# Patient Record
Sex: Female | Born: 1955 | Race: Black or African American | Hispanic: No | Marital: Single | Smoking: Never smoker
Health system: Southern US, Community
[De-identification: ages and names within clinical notes are randomized; demographics above are authoritative.]

## PROBLEM LIST (undated history)

## (undated) DIAGNOSIS — E119 Type 2 diabetes mellitus without complications: Secondary | ICD-10-CM

## (undated) DIAGNOSIS — T466X5A Adverse effect of antihyperlipidemic and antiarteriosclerotic drugs, initial encounter: Secondary | ICD-10-CM

## (undated) DIAGNOSIS — I359 Nonrheumatic aortic valve disorder, unspecified: Secondary | ICD-10-CM

## (undated) DIAGNOSIS — I10 Essential (primary) hypertension: Secondary | ICD-10-CM

## (undated) DIAGNOSIS — G720 Drug-induced myopathy: Secondary | ICD-10-CM

## (undated) DIAGNOSIS — E785 Hyperlipidemia, unspecified: Secondary | ICD-10-CM

## (undated) HISTORY — PX: COLONOSCOPY: SHX174

---

## 2004-05-10 ENCOUNTER — Emergency Department: Payer: Self-pay | Admitting: Emergency Medicine

## 2004-05-16 ENCOUNTER — Ambulatory Visit: Payer: Self-pay | Admitting: Unknown Physician Specialty

## 2005-01-04 ENCOUNTER — Emergency Department: Payer: Self-pay | Admitting: Emergency Medicine

## 2005-04-07 ENCOUNTER — Other Ambulatory Visit: Payer: Self-pay

## 2005-04-07 ENCOUNTER — Emergency Department: Payer: Self-pay | Admitting: Emergency Medicine

## 2006-03-08 ENCOUNTER — Ambulatory Visit: Payer: Self-pay | Admitting: Gastroenterology

## 2009-02-25 ENCOUNTER — Emergency Department: Payer: Self-pay | Admitting: Emergency Medicine

## 2009-03-03 ENCOUNTER — Ambulatory Visit: Payer: Self-pay | Admitting: Family Medicine

## 2009-08-11 ENCOUNTER — Ambulatory Visit: Payer: Self-pay | Admitting: Internal Medicine

## 2009-11-15 ENCOUNTER — Other Ambulatory Visit: Payer: Self-pay | Admitting: Internal Medicine

## 2010-09-15 ENCOUNTER — Ambulatory Visit: Payer: Self-pay | Admitting: Unknown Physician Specialty

## 2010-09-16 LAB — PATHOLOGY REPORT

## 2012-03-29 ENCOUNTER — Emergency Department: Payer: Self-pay | Admitting: Emergency Medicine

## 2012-03-29 LAB — CK TOTAL AND CKMB (NOT AT ARMC)
CK, Total: 154 U/L (ref 21–215)
CK-MB: 0.9 ng/mL (ref 0.5–3.6)

## 2012-03-29 LAB — URINALYSIS, COMPLETE
Bilirubin,UR: NEGATIVE
Glucose,UR: NEGATIVE mg/dL (ref 0–75)
Nitrite: NEGATIVE
Ph: 6 (ref 4.5–8.0)
Protein: NEGATIVE
RBC,UR: 1 /HPF (ref 0–5)
Specific Gravity: 1.004 (ref 1.003–1.030)
Squamous Epithelial: 2
WBC UR: 2 /HPF (ref 0–5)

## 2012-03-29 LAB — CBC
HCT: 36.5 % (ref 35.0–47.0)
MCH: 28.2 pg (ref 26.0–34.0)
MCHC: 32.7 g/dL (ref 32.0–36.0)
MCV: 86 fL (ref 80–100)
Platelet: 266 10*3/uL (ref 150–440)
RDW: 13.4 % (ref 11.5–14.5)

## 2012-03-29 LAB — TROPONIN I: Troponin-I: <0.02 ng/mL

## 2012-03-29 LAB — COMPREHENSIVE METABOLIC PANEL
BUN: 7 mg/dL (ref 7–18)
Bilirubin,Total: 0.4 mg/dL (ref 0.2–1.0)
Calcium, Total: 8.7 mg/dL (ref 8.5–10.1)
Co2: 31 mmol/L (ref 21–32)
EGFR (Non-African Amer.): >60
Glucose: 113 mg/dL — ABNORMAL HIGH (ref 65–99)
SGPT (ALT): 20 U/L (ref 12–78)
Sodium: 139 mmol/L (ref 136–145)

## 2012-03-30 LAB — URINE CULTURE

## 2016-09-17 ENCOUNTER — Encounter: Payer: Self-pay | Admitting: Emergency Medicine

## 2016-09-17 ENCOUNTER — Emergency Department
Admission: EM | Admit: 2016-09-17 | Discharge: 2016-09-17 | Disposition: A | Discharge status: Home / Self Care | Payer: 59 | Attending: Emergency Medicine | Admitting: Emergency Medicine

## 2016-09-17 ENCOUNTER — Emergency Department: Payer: 59

## 2016-09-17 DIAGNOSIS — R002 Palpitations: Secondary | ICD-10-CM | POA: Diagnosis not present

## 2016-09-17 DIAGNOSIS — I499 Cardiac arrhythmia, unspecified: Secondary | ICD-10-CM | POA: Diagnosis not present

## 2016-09-17 DIAGNOSIS — Z0000 Encounter for general adult medical examination without abnormal findings: Secondary | ICD-10-CM | POA: Diagnosis not present

## 2016-09-17 DIAGNOSIS — R000 Tachycardia, unspecified: Secondary | ICD-10-CM | POA: Insufficient documentation

## 2016-09-17 HISTORY — DX: Hyperlipidemia, unspecified: E78.5

## 2016-09-17 LAB — BASIC METABOLIC PANEL
Anion gap: 7 (ref 5–15)
BUN: 10 mg/dL (ref 6–20)
CHLORIDE: 104 mmol/L (ref 101–111)
CO2: 27 mmol/L (ref 22–32)
Calcium: 8.9 mg/dL (ref 8.9–10.3)
Creatinine, Ser: 0.82 mg/dL (ref 0.44–1.00)
GFR calc Af Amer: >60 mL/min (ref >60)
GFR calc non Af Amer: >60 mL/min (ref >60)
GLUCOSE: 147 mg/dL — AB (ref 65–99)
POTASSIUM: 3.4 mmol/L — AB (ref 3.5–5.1)
Sodium: 138 mmol/L (ref 135–145)

## 2016-09-17 LAB — TROPONIN I
Troponin I: <0.03 ng/mL (ref <0.03)
Troponin I: <0.03 ng/mL (ref <0.03)

## 2016-09-17 LAB — CBC
HEMATOCRIT: 36.5 % (ref 35.0–47.0)
Hemoglobin: 12.2 g/dL (ref 12.0–16.0)
MCH: 28.3 pg (ref 26.0–34.0)
MCHC: 33.3 g/dL (ref 32.0–36.0)
MCV: 85.1 fL (ref 80.0–100.0)
Platelets: 246 10*3/uL (ref 150–440)
RBC: 4.29 MIL/uL (ref 3.80–5.20)
RDW: 13.4 % (ref 11.5–14.5)
WBC: 6.6 10*3/uL (ref 3.6–11.0)

## 2016-09-17 LAB — MAGNESIUM: Magnesium: 1.6 mg/dL — ABNORMAL LOW (ref 1.7–2.4)

## 2016-09-17 MED ORDER — MAGNESIUM SULFATE 2 GM/50ML IV SOLN
2.0000 g | Freq: Once | INTRAVENOUS | Status: AC
  Administered 2016-09-17: 2 g via INTRAVENOUS
  Filled 2016-09-17: qty 50

## 2016-09-17 MED ORDER — SODIUM CHLORIDE 0.9 % IV BOLUS (SEPSIS)
1000.0000 mL | Freq: Once | INTRAVENOUS | Status: AC
  Administered 2016-09-17: 1000 mL via INTRAVENOUS

## 2016-09-17 NOTE — ED Provider Notes (Signed)
[facility] Emergency Department Provider Note   ____________________________________________   First MD Initiated Contact with Patient 09/17/16 [phone]     (approximate)  I have reviewed the triage vital signs and the nursing notes.   HISTORY  Chief Complaint Tachycardia    HPI Donna Hardy is a 61 y.o. female who comes into the hospital today with palpitations. She woke up with a heart rate that she reports was about 120. The patient denies any chest pain or shortness of breath. She went to bed and felt like her heart was racing a little bitbut thought she just needed to get some rest. The patient reports that when she woke up it was going much faster and she was concerned so she called EMS. The patient denies any chest pain, shortness of breath, lightheadedness dizziness nausea or vomiting. The patient said that she listened to her heart rate and it did not sound irregular and it did not feel irregular. The patient has never had this happen before. She reports that yesterday she was with some nursing student since she had some tea which she normally doesn't have. She also reports that she did not drink much yesterday. The patient drank a little water for lunch and then about 500 ML's when she arrived home. The patient is here today for evaluation of this tachycardia.   Past Medical History:  Diagnosis Date  . Hyperlipidemia     There are no active problems to display for this patient.   History reviewed. No pertinent surgical history.  Prior to Admission medications   Medication Sig Start Date End Date Taking? Authorizing Provider  losartan-hydrochlorothiazide (HYZAAR) 100-12.5 MG tablet Take 1 tablet by mouth daily. 04/12/16 04/12/17 Yes [provider]    Allergies Patient has no known allergies.  History reviewed. No pertinent family history.  Social History Social History  Substance Use Topics  . Smoking status: Never Smoker  .  Smokeless tobacco: Never Used  . Alcohol use No    Review of Systems  Constitutional: No fever/chills Eyes: No visual changes. ENT: No sore throat. Cardiovascular: Denies chest pain. Respiratory: Denies shortness of breath. Gastrointestinal: No abdominal pain.  No nausea, no vomiting.  No diarrhea.  No constipation. Genitourinary: Negative for dysuria. Musculoskeletal: Negative for back pain. Skin: Negative for rash. Neurological: Negative for headaches, focal weakness or numbness.   ____________________________________________   PHYSICAL EXAM:  VITAL SIGNS: ED Triage Vitals  Enc Vitals Group     BP 09/17/16 0502 (!) 152/80     Pulse Rate 09/17/16 0502 86     Resp 09/17/16 0502 14     Temp 09/17/16 0502 98.5 F (36.9 C)     Temp Source 09/17/16 0502 Oral     SpO2 09/17/16 0502 100 %     Weight 09/17/16 0501 230 lb (104.3 kg)     Height 09/17/16 0501 5\' 5"  (1.651 m)     Head Circumference --      Peak Flow --      Pain Score --      Pain Loc --      Pain Edu? --      Excl. in GC? --     Constitutional: Alert and oriented. Well appearing and in no acute distress. Eyes: Conjunctivae are normal. PERRL. EOMI. Head: Atraumatic. Nose: No congestion/rhinnorhea. Mouth/Throat: Mucous membranes are moist.  Oropharynx non-erythematous. Cardiovascular: Normal rate, regular rhythm. Grossly normal heart sounds.  Good peripheral circulation. Respiratory: Normal respiratory effort.  No  retractions. Lungs CTAB. Gastrointestinal: Soft and nontender. No distention. Positive bowel sounds Musculoskeletal: No lower extremity tenderness nor edema.   Neurologic:  Normal speech and language.  Skin:  Skin is warm, dry and intact.  Psychiatric: Mood and affect are normal.   ____________________________________________   LABS (all labs ordered are listed, but only abnormal results are displayed)  Labs Reviewed  BASIC METABOLIC PANEL - Abnormal; Notable for the following:        Result Value   Potassium 3.4 (*)    Glucose, Bld 147 (*)    All other components within normal limits  MAGNESIUM - Abnormal; Notable for the following:    Magnesium 1.6 (*)    All other components within normal limits  CBC  TROPONIN I  TROPONIN I   ____________________________________________  EKG  ED ECG REPORT I, Rebecka Apley, the attending physician, personally viewed and interpreted this ECG.   Date: 09/17/2016  EKG Time: 0503  Rate: 97  Rhythm: normal sinus rhythm  Axis: normal  Intervals:none  ST&T Change: none  ____________________________________________  RADIOLOGY  Dg Chest 2 View  Result Date: 09/17/2016 CLINICAL DATA:  Tachycardia. EXAM: CHEST  2 VIEW COMPARISON:  None. FINDINGS: Cardiomediastinal silhouette is normal. No pleural effusions or focal consolidations. Trachea projects midline and there is no pneumothorax. Soft tissue planes and included osseous structures are non-suspicious. Mild degenerative change of the thoracic spine. IMPRESSION: Negative chest. Electronically Signed   By: Awilda Metro M.D.   On: 09/17/2016 06:04    ____________________________________________   PROCEDURES  Procedure(s) performed: None  Procedures  Critical Care performed: No  ____________________________________________   INITIAL IMPRESSION / ASSESSMENT AND PLAN / ED COURSE  Pertinent labs & imaging results that were available during my care of the patient were reviewed by me and considered in my medical decision making (see chart for details).  This is a 61 year old female who comes into the hospital today with some palpitations. The patient woke up having these symptoms. According to EMS her heart rate was sinus tachycardia at 101 when they were able to perform an EKG. I did check some blood work on the patient to include magnesium and was found that her magnesium was low at 1.6. I did give the patient a dose of magnesium sulfate as well as a liter of  normal saline. She will be reassessed with a repeat troponin after her medication has been administered.     The patient's heart rate is unremarkable. I will repeat the patient's troponin and short that she did not have any cardiac insult from her palpitations. The patient's care was signed out to Dr. Vernona Rieger will follow-up the results of the patient's troponin and disposition the patient.  ____________________________________________   FINAL CLINICAL IMPRESSION(S) / ED DIAGNOSES  Final diagnoses:  Palpitations  Tachycardia  Hypomagnesemia      NEW MEDICATIONS STARTED DURING THIS VISIT:  New Prescriptions   No medications on file     Note:  This document was prepared using Dragon voice recognition software and may include unintentional dictation errors.    Rebecka Apley, MD 09/17/16 [phone]

## 2016-09-17 NOTE — ED Provider Notes (Signed)
[facility]  I accepted care from Dr. Zenda Alpers ____________________________________________    LABS (pertinent positives/negatives)  Labs Reviewed  BASIC METABOLIC PANEL - Abnormal; Notable for the following:       Result Value   Potassium 3.4 (*)    Glucose, Bld 147 (*)    All other components within normal limits  MAGNESIUM - Abnormal; Notable for the following:    Magnesium 1.6 (*)    All other components within normal limits  CBC  TROPONIN I  TROPONIN I       ____________________________________________   PROCEDURES  Procedure(s) performed: None  Critical Care performed: None  ____________________________________________   INITIAL IMPRESSION / ASSESSMENT AND PLAN / ED COURSE   Pertinent labs & imaging results that were available during my care of the patient were reviewed by me and considered in my medical decision making (see chart for details).  Patient sign out from Dr. Zenda Alpers. Patient overall well-appearing. Had an episode of sinus tachycardia, resolved. Awaiting repeat troponin.  Repeat troponin negative. Patient will be discharged with Dr. Leone Payor prepared discharge instructions. I did update the patient.  CONSULTATIONS:None    Patient / Family / Caregiver informed of clinical course, medical decision-making process, and agree with plan.   I discussed return precautions, follow-up instructions, and discharged instructions with patient and/or family.   ____________________________________________   FINAL CLINICAL IMPRESSION(S) / ED DIAGNOSES  Final diagnoses:  Palpitations  Tachycardia  Hypomagnesemia        Governor Rooks, MD 09/17/16 [phone]

## 2016-09-17 NOTE — ED Notes (Signed)
Report to angela, rn. 

## 2016-09-17 NOTE — ED Notes (Signed)
Pt sitting in stretcher, calm denies pain even unlabored respirations , skin warm and dry

## 2016-09-17 NOTE — ED Notes (Signed)
Patient transported to X-ray 

## 2016-09-17 NOTE — ED Notes (Signed)
ED Provider at bedside. 

## 2016-09-17 NOTE — ED Triage Notes (Signed)
Pt to rm 7 via EMS from home, reports heart palpitations, felt rate was fast but regular, denies CP

## 2016-09-17 NOTE — Discharge Instructions (Signed)
You were evaluated for palpitations and urine evaluation was unremarkable except for a low magnesium level. Please follow back up with a primary care physician for further evaluation, please ensure that you stay hydrated

## 2016-09-17 NOTE — ED Notes (Signed)
Pt denies needs. Call bell at left side.

## 2016-10-05 DIAGNOSIS — I10 Essential (primary) hypertension: Secondary | ICD-10-CM | POA: Diagnosis not present

## 2016-10-05 DIAGNOSIS — E7800 Pure hypercholesterolemia, unspecified: Secondary | ICD-10-CM | POA: Diagnosis not present

## 2016-10-05 DIAGNOSIS — E119 Type 2 diabetes mellitus without complications: Secondary | ICD-10-CM | POA: Diagnosis not present

## 2016-10-11 DIAGNOSIS — Z0000 Encounter for general adult medical examination without abnormal findings: Secondary | ICD-10-CM | POA: Diagnosis not present

## 2016-10-11 DIAGNOSIS — I10 Essential (primary) hypertension: Secondary | ICD-10-CM | POA: Diagnosis not present

## 2016-10-11 DIAGNOSIS — E119 Type 2 diabetes mellitus without complications: Secondary | ICD-10-CM | POA: Diagnosis not present

## 2016-10-11 DIAGNOSIS — E7800 Pure hypercholesterolemia, unspecified: Secondary | ICD-10-CM | POA: Diagnosis not present

## 2016-11-09 DIAGNOSIS — Z124 Encounter for screening for malignant neoplasm of cervix: Secondary | ICD-10-CM | POA: Diagnosis not present

## 2016-11-09 DIAGNOSIS — Z1151 Encounter for screening for human papillomavirus (HPV): Secondary | ICD-10-CM | POA: Diagnosis not present

## 2016-11-09 DIAGNOSIS — Z1211 Encounter for screening for malignant neoplasm of colon: Secondary | ICD-10-CM | POA: Diagnosis not present

## 2016-11-09 DIAGNOSIS — Z01419 Encounter for gynecological examination (general) (routine) without abnormal findings: Secondary | ICD-10-CM | POA: Diagnosis not present

## 2017-07-18 DIAGNOSIS — B029 Zoster without complications: Secondary | ICD-10-CM | POA: Diagnosis not present

## 2017-08-28 DIAGNOSIS — J069 Acute upper respiratory infection, unspecified: Secondary | ICD-10-CM | POA: Diagnosis not present

## 2017-12-30 DIAGNOSIS — H6991 Unspecified Eustachian tube disorder, right ear: Secondary | ICD-10-CM | POA: Diagnosis not present

## 2017-12-30 DIAGNOSIS — H6501 Acute serous otitis media, right ear: Secondary | ICD-10-CM | POA: Diagnosis not present

## 2018-01-31 DIAGNOSIS — E119 Type 2 diabetes mellitus without complications: Secondary | ICD-10-CM | POA: Diagnosis not present

## 2018-01-31 DIAGNOSIS — I10 Essential (primary) hypertension: Secondary | ICD-10-CM | POA: Diagnosis not present

## 2018-01-31 DIAGNOSIS — E7800 Pure hypercholesterolemia, unspecified: Secondary | ICD-10-CM | POA: Diagnosis not present

## 2018-02-01 DIAGNOSIS — E7800 Pure hypercholesterolemia, unspecified: Secondary | ICD-10-CM | POA: Diagnosis not present

## 2018-02-01 DIAGNOSIS — I10 Essential (primary) hypertension: Secondary | ICD-10-CM | POA: Diagnosis not present

## 2018-02-01 DIAGNOSIS — E119 Type 2 diabetes mellitus without complications: Secondary | ICD-10-CM | POA: Diagnosis not present

## 2018-05-02 DIAGNOSIS — E7800 Pure hypercholesterolemia, unspecified: Secondary | ICD-10-CM | POA: Diagnosis not present

## 2018-05-02 DIAGNOSIS — E119 Type 2 diabetes mellitus without complications: Secondary | ICD-10-CM | POA: Diagnosis not present

## 2018-05-02 DIAGNOSIS — I10 Essential (primary) hypertension: Secondary | ICD-10-CM | POA: Diagnosis not present

## 2018-05-28 DIAGNOSIS — I10 Essential (primary) hypertension: Secondary | ICD-10-CM | POA: Diagnosis not present

## 2018-05-28 DIAGNOSIS — E119 Type 2 diabetes mellitus without complications: Secondary | ICD-10-CM | POA: Diagnosis not present

## 2018-05-28 DIAGNOSIS — Z6841 Body Mass Index (BMI) 40.0 and over, adult: Secondary | ICD-10-CM | POA: Diagnosis not present

## 2018-10-11 ENCOUNTER — Ambulatory Visit (LOCAL_COMMUNITY_HEALTH_CENTER): Payer: Self-pay

## 2018-10-11 ENCOUNTER — Other Ambulatory Visit: Payer: Self-pay

## 2018-10-11 DIAGNOSIS — Z111 Encounter for screening for respiratory tuberculosis: Secondary | ICD-10-CM

## 2018-10-14 ENCOUNTER — Other Ambulatory Visit: Payer: Self-pay

## 2018-10-14 ENCOUNTER — Ambulatory Visit: Payer: Self-pay

## 2018-10-14 DIAGNOSIS — Z0184 Encounter for antibody response examination: Secondary | ICD-10-CM

## 2018-10-14 DIAGNOSIS — Z111 Encounter for screening for respiratory tuberculosis: Secondary | ICD-10-CM

## 2018-10-14 LAB — TB SKIN TEST
Induration: 0 mm
TB Skin Test: NEGATIVE

## 2018-10-14 NOTE — Progress Notes (Signed)
Client also desires Varicella titer. ROI signed and client aware clinic will call her when result available for pick up which is usually in 24 - 48 hours. Rich Number, RN

## 2018-10-15 ENCOUNTER — Telehealth: Payer: Self-pay

## 2018-10-15 LAB — VARICELLA ZOSTER ANTIBODY, IGG: Varicella zoster IgG: >4000 index (ref >165)

## 2018-10-15 NOTE — Telephone Encounter (Signed)
Varicella titer results >4000 and indicates immunity so no vaccine needed. Phone call to client and left message regarding results and that available for pick up at [facility] information booth across from elevator. Rich Number, RN

## 2019-07-11 IMAGING — CR DG CHEST 2V
1 series · 2 of 2 positions shown · non-contrast
Comparison: None.

CLINICAL DATA: Tachycardia.

EXAM:
CHEST  2 VIEW

[Series 1: dg chest 2 view · 0.14mm/px · 2 of 2 slices shown]
[im 1/2]
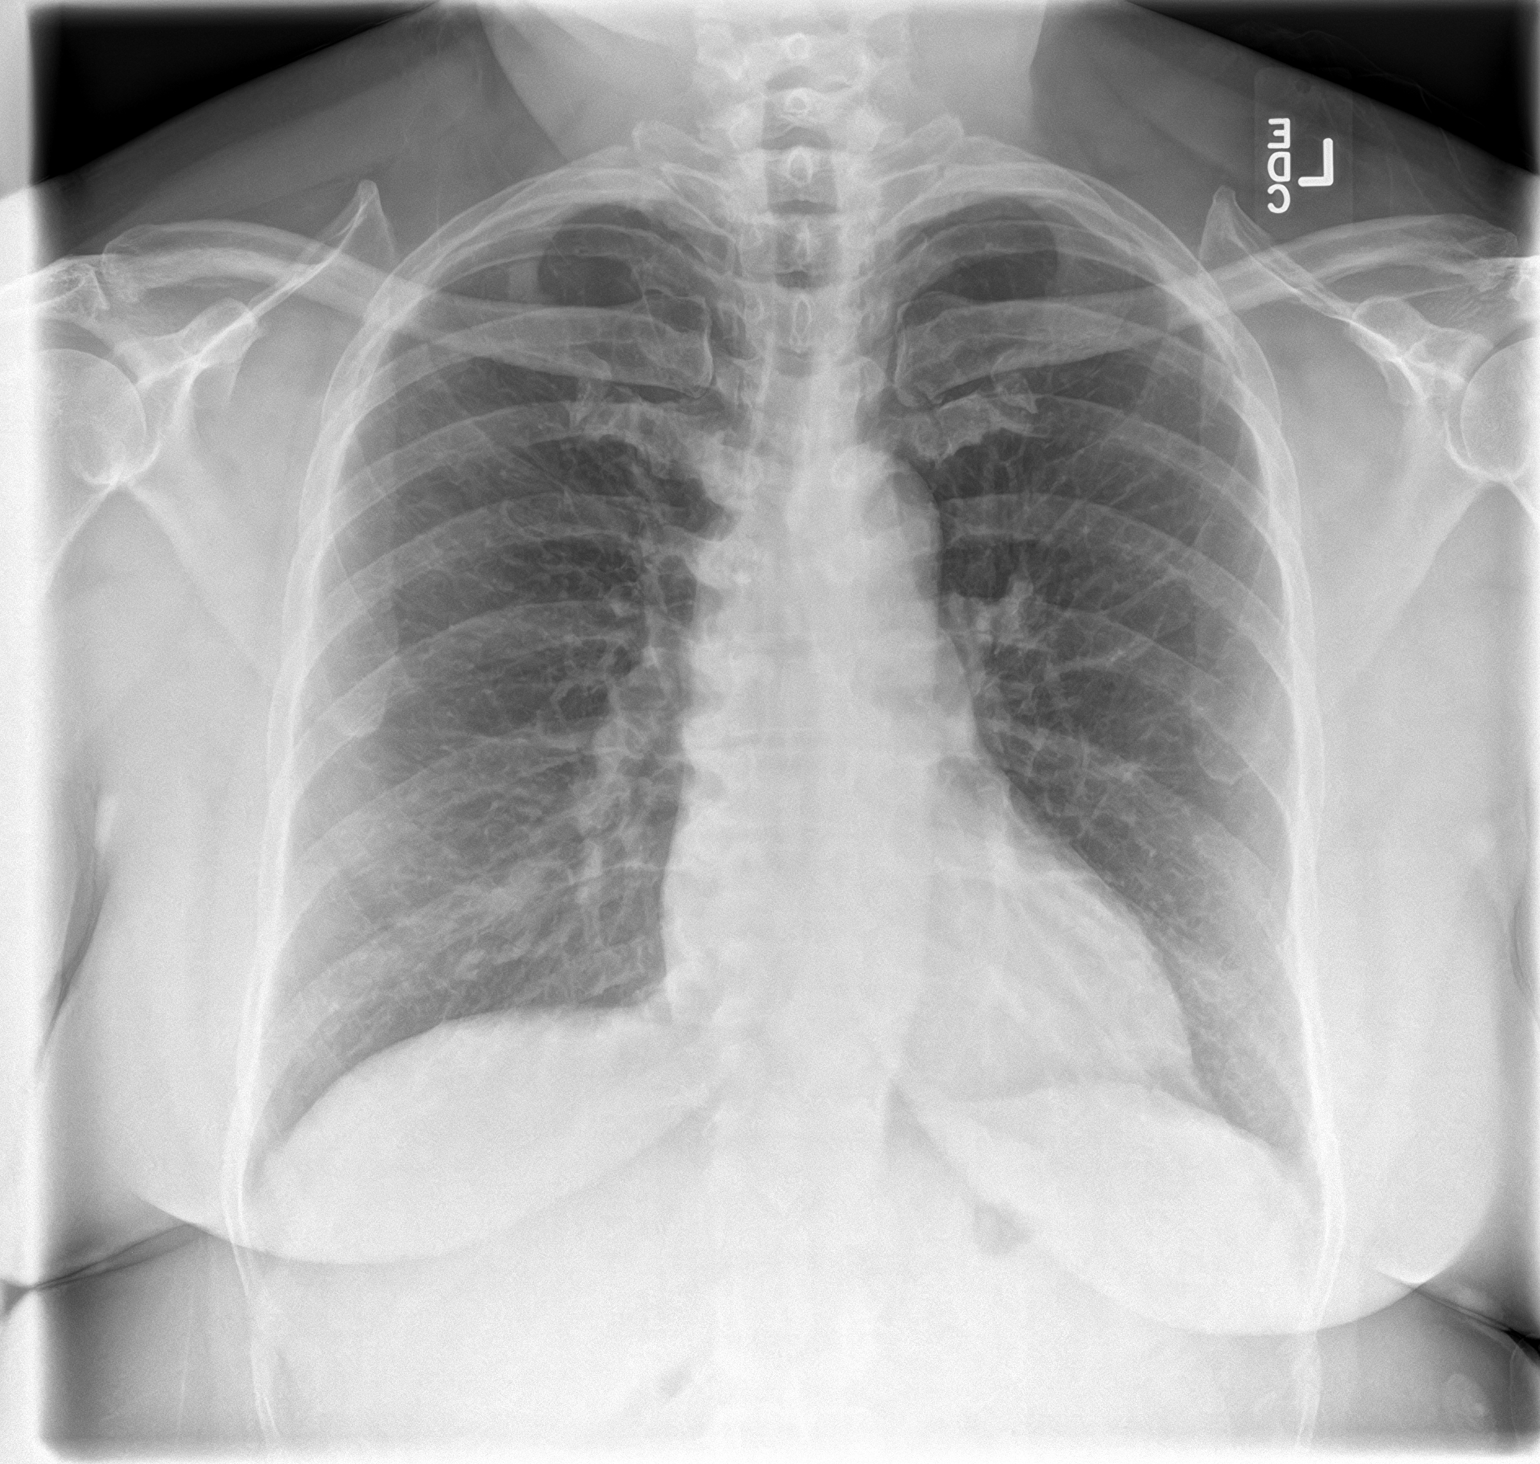
[im 2/2]
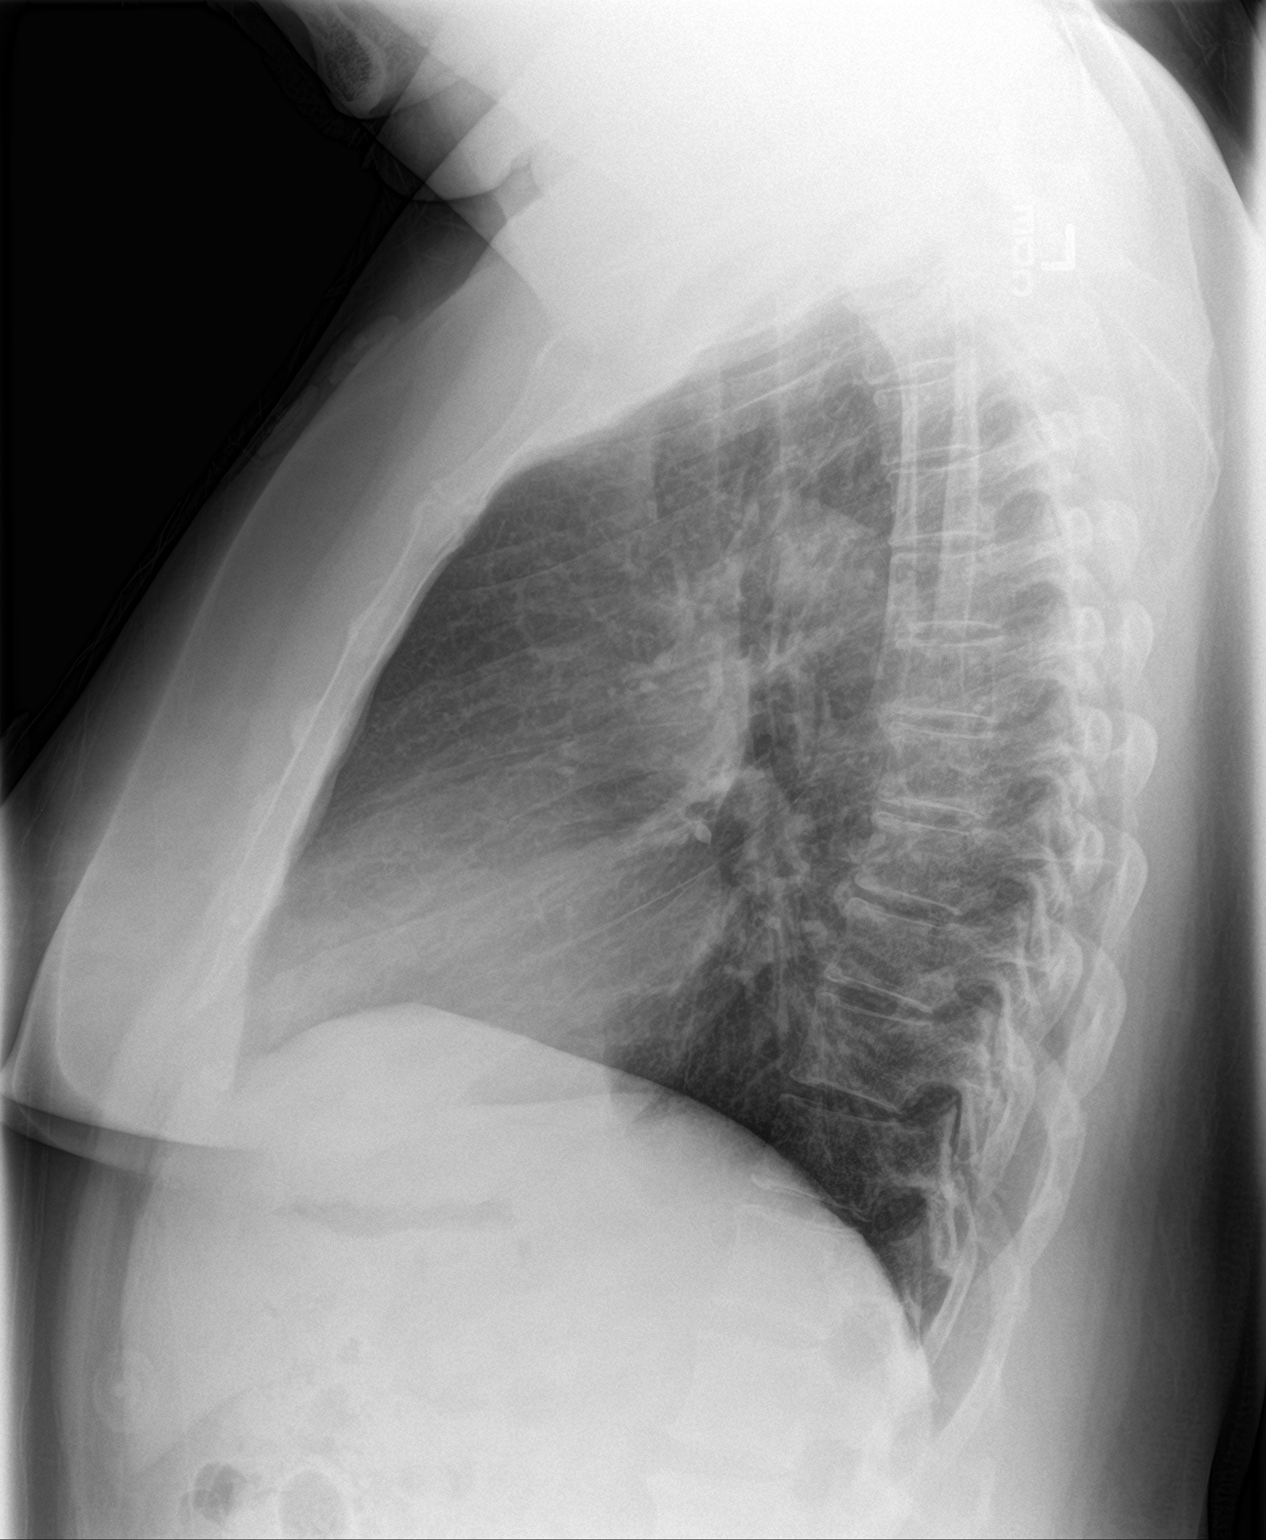

[2 of 2 positions shown; findings below may reference images not displayed]

FINDINGS: Cardiomediastinal silhouette is normal. No pleural effusions or
focal consolidations. Trachea projects midline and there is no
pneumothorax. Soft tissue planes and included osseous structures are
non-suspicious. Mild degenerative change of the thoracic spine.
IMPRESSION: Negative chest.

## 2019-08-13 ENCOUNTER — Other Ambulatory Visit: Payer: Self-pay | Admitting: Internal Medicine

## 2019-12-02 ENCOUNTER — Other Ambulatory Visit: Payer: Self-pay | Admitting: Internal Medicine

## 2020-03-18 ENCOUNTER — Other Ambulatory Visit: Payer: Self-pay | Admitting: Internal Medicine

## 2020-05-27 ENCOUNTER — Other Ambulatory Visit: Payer: Self-pay

## 2020-05-27 MED FILL — Hydrochlorothiazide Cap 12.5 MG: ORAL | 90 days supply | Qty: 90 | Fill #0 | Status: AC

## 2020-05-27 MED FILL — Losartan Potassium Tab 100 MG: ORAL | 90 days supply | Qty: 90 | Fill #0 | Status: AC

## 2020-06-08 ENCOUNTER — Other Ambulatory Visit: Payer: Self-pay

## 2020-06-08 ENCOUNTER — Ambulatory Visit (LOCAL_COMMUNITY_HEALTH_CENTER): Payer: Self-pay

## 2020-06-08 DIAGNOSIS — Z111 Encounter for screening for respiratory tuberculosis: Secondary | ICD-10-CM

## 2020-06-08 NOTE — Progress Notes (Signed)
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 ROI signed and explained to pt that RN will call her with results (home [phone] or cell [phone].) To lab for blood draw. Jerel Shepherd, RN

## 2020-06-12 LAB — QUANTIFERON-TB GOLD PLUS
QuantiFERON Mitogen Value: >10 IU/mL
QuantiFERON Nil Value: 0.04 IU/mL
QuantiFERON TB1 Ag Value: 0.03 IU/mL
QuantiFERON TB2 Ag Value: 0.02 IU/mL
QuantiFERON-TB Gold Plus: NEGATIVE

## 2020-06-18 ENCOUNTER — Other Ambulatory Visit (HOSPITAL_BASED_OUTPATIENT_CLINIC_OR_DEPARTMENT_OTHER): Payer: Self-pay

## 2020-08-25 ENCOUNTER — Other Ambulatory Visit: Payer: Self-pay

## 2020-08-25 MED FILL — Hydrochlorothiazide Cap 12.5 MG: ORAL | 90 days supply | Qty: 90 | Fill #0 | Status: AC

## 2020-08-25 MED FILL — Losartan Potassium Tab 100 MG: ORAL | 90 days supply | Qty: 90 | Fill #0 | Status: AC

## 2020-12-10 ENCOUNTER — Other Ambulatory Visit: Payer: Self-pay

## 2020-12-10 MED ORDER — LOSARTAN POTASSIUM 100 MG PO TABS
ORAL_TABLET | ORAL | 1 refills | Status: AC
  Filled 2020-12-10: qty 90, 90d supply, fill #0
  Filled 2021-03-18: qty 90, 90d supply, fill #1

## 2020-12-10 MED ORDER — HYDROCHLOROTHIAZIDE 12.5 MG PO CAPS
ORAL_CAPSULE | ORAL | 1 refills | Status: DC
  Filled 2020-12-10: qty 90, 90d supply, fill #0
  Filled 2021-03-18: qty 90, 90d supply, fill #1

## 2021-02-10 ENCOUNTER — Other Ambulatory Visit: Payer: Self-pay

## 2021-02-10 MED ORDER — JARDIANCE 10 MG PO TABS
10.0000 mg | ORAL_TABLET | Freq: Every day | ORAL | 11 refills | Status: AC
  Filled 2021-02-10: qty 90, 90d supply, fill #0
  Filled 2021-03-18: qty 30, 30d supply, fill #0

## 2021-03-18 ENCOUNTER — Other Ambulatory Visit: Payer: Self-pay

## 2021-06-21 ENCOUNTER — Other Ambulatory Visit: Payer: Self-pay

## 2021-06-21 MED ORDER — LOSARTAN POTASSIUM 100 MG PO TABS
ORAL_TABLET | ORAL | 1 refills | Status: DC
  Filled 2021-06-21: qty 90, 90d supply, fill #0
  Filled 2021-09-22: qty 90, 90d supply, fill #1

## 2021-06-22 ENCOUNTER — Other Ambulatory Visit: Payer: Self-pay

## 2021-06-22 MED ORDER — HYDROCHLOROTHIAZIDE 12.5 MG PO CAPS
ORAL_CAPSULE | ORAL | 1 refills | Status: DC
  Filled 2021-06-22: qty 90, 90d supply, fill #0
  Filled 2021-09-22: qty 90, 90d supply, fill #1

## 2021-06-23 ENCOUNTER — Other Ambulatory Visit: Payer: Self-pay

## 2021-07-19 ENCOUNTER — Other Ambulatory Visit: Payer: Self-pay

## 2021-07-19 MED ORDER — JARDIANCE 10 MG PO TABS: 10.0000 mg | ORAL_TABLET | Freq: Every day | ORAL | 3 refills | Status: AC

## 2021-09-22 ENCOUNTER — Other Ambulatory Visit: Payer: Self-pay

## 2021-09-27 ENCOUNTER — Other Ambulatory Visit: Payer: Self-pay

## 2021-12-12 ENCOUNTER — Other Ambulatory Visit: Payer: Self-pay

## 2021-12-12 MED ORDER — HYDROCHLOROTHIAZIDE 12.5 MG PO CAPS
12.5000 mg | ORAL_CAPSULE | Freq: Every day | ORAL | 0 refills | Status: DC
  Filled 2021-12-12: qty 90, 90d supply, fill #0

## 2021-12-12 MED ORDER — LOSARTAN POTASSIUM 100 MG PO TABS
100.0000 mg | ORAL_TABLET | Freq: Every day | ORAL | 0 refills | Status: AC
  Filled 2021-12-12: qty 90, 90d supply, fill #0

## 2021-12-19 ENCOUNTER — Other Ambulatory Visit: Payer: Self-pay

## 2021-12-21 ENCOUNTER — Other Ambulatory Visit: Payer: Self-pay

## 2022-03-13 ENCOUNTER — Other Ambulatory Visit: Payer: Self-pay

## 2022-04-04 ENCOUNTER — Other Ambulatory Visit: Payer: Self-pay

## 2022-04-06 ENCOUNTER — Other Ambulatory Visit: Payer: Self-pay

## 2022-04-06 MED ORDER — LOSARTAN POTASSIUM 100 MG PO TABS
100.0000 mg | ORAL_TABLET | Freq: Every day | ORAL | 3 refills | Status: AC
  Filled 2022-04-06: qty 90, 90d supply, fill #0

## 2022-04-07 ENCOUNTER — Other Ambulatory Visit: Payer: Self-pay

## 2022-04-07 MED ORDER — HYDROCHLOROTHIAZIDE 12.5 MG PO CAPS
12.5000 mg | ORAL_CAPSULE | Freq: Every day | ORAL | 1 refills | Status: AC
  Filled 2022-04-07: qty 90, 90d supply, fill #0

## 2022-06-16 ENCOUNTER — Other Ambulatory Visit: Payer: Self-pay

## 2022-06-16 MED ORDER — RYBELSUS 3 MG PO TABS
3.0000 mg | ORAL_TABLET | Freq: Every day | ORAL | 0 refills | Status: DC
  Filled 2022-06-16 – 2022-07-20 (×3): qty 30, 30d supply, fill #0

## 2022-06-16 MED ORDER — RYBELSUS 7 MG PO TABS: 7.0000 mg | ORAL_TABLET | Freq: Every day | ORAL | 3 refills | Status: DC

## 2022-07-11 ENCOUNTER — Other Ambulatory Visit (HOSPITAL_COMMUNITY): Payer: Self-pay

## 2022-07-11 MED ORDER — HYDROCHLOROTHIAZIDE 12.5 MG PO CAPS
12.5000 mg | ORAL_CAPSULE | Freq: Every day | ORAL | 1 refills | Status: AC
  Filled 2022-07-11 – 2022-07-13 (×2): qty 90, 90d supply, fill #0
  Filled 2022-10-03: qty 90, 90d supply, fill #1

## 2022-07-11 MED ORDER — LOSARTAN POTASSIUM 100 MG PO TABS
100.0000 mg | ORAL_TABLET | Freq: Every day | ORAL | 1 refills | Status: AC
  Filled 2022-07-11 – 2022-07-13 (×2): qty 90, 90d supply, fill #0
  Filled 2022-10-03: qty 90, 90d supply, fill #1

## 2022-07-13 ENCOUNTER — Other Ambulatory Visit: Payer: Self-pay

## 2022-07-13 ENCOUNTER — Other Ambulatory Visit (HOSPITAL_COMMUNITY): Payer: Self-pay

## 2022-07-18 ENCOUNTER — Other Ambulatory Visit: Payer: Self-pay

## 2022-07-20 ENCOUNTER — Other Ambulatory Visit: Payer: Self-pay

## 2022-08-08 ENCOUNTER — Other Ambulatory Visit: Payer: Self-pay

## 2022-10-03 ENCOUNTER — Other Ambulatory Visit: Payer: Self-pay

## 2022-10-09 ENCOUNTER — Other Ambulatory Visit: Payer: Self-pay

## 2022-12-12 ENCOUNTER — Ambulatory Visit (LOCAL_COMMUNITY_HEALTH_CENTER): Payer: Self-pay

## 2022-12-12 DIAGNOSIS — Z111 Encounter for screening for respiratory tuberculosis: Secondary | ICD-10-CM

## 2022-12-15 ENCOUNTER — Ambulatory Visit (LOCAL_COMMUNITY_HEALTH_CENTER): Payer: Self-pay

## 2022-12-15 DIAGNOSIS — Z111 Encounter for screening for respiratory tuberculosis: Secondary | ICD-10-CM

## 2022-12-15 LAB — TB SKIN TEST
Induration: 0 mm
TB Skin Test: NEGATIVE

## 2023-01-11 ENCOUNTER — Other Ambulatory Visit: Payer: Self-pay

## 2023-01-11 MED ORDER — HYDROCHLOROTHIAZIDE 12.5 MG PO CAPS
12.5000 mg | ORAL_CAPSULE | Freq: Every day | ORAL | 1 refills | Status: DC
  Filled 2023-01-11: qty 90, 90d supply, fill #0
  Filled 2023-04-19: qty 90, 90d supply, fill #1

## 2023-01-11 MED ORDER — LOSARTAN POTASSIUM 100 MG PO TABS
100.0000 mg | ORAL_TABLET | Freq: Every day | ORAL | 1 refills | Status: DC
  Filled 2023-01-11: qty 90, 90d supply, fill #0
  Filled 2023-04-19: qty 90, 90d supply, fill #1

## 2023-02-27 ENCOUNTER — Other Ambulatory Visit: Payer: Self-pay

## 2023-02-27 MED ORDER — JARDIANCE 10 MG PO TABS
10.0000 mg | ORAL_TABLET | Freq: Every day | ORAL | 11 refills | Status: DC
  Filled 2023-02-27: qty 30, 30d supply, fill #0

## 2023-03-13 ENCOUNTER — Other Ambulatory Visit: Payer: Self-pay

## 2023-04-19 ENCOUNTER — Other Ambulatory Visit: Payer: Self-pay

## 2023-04-20 ENCOUNTER — Other Ambulatory Visit: Payer: Self-pay

## 2023-06-11 ENCOUNTER — Other Ambulatory Visit: Payer: Self-pay

## 2023-06-11 MED ORDER — NA SULFATE-K SULFATE-MG SULF 17.5-3.13-1.6 GM/177ML PO SOLN
ORAL | 0 refills | Status: DC
  Filled 2023-06-11 – 2023-07-16 (×2): qty 354, 1d supply, fill #0

## 2023-06-26 ENCOUNTER — Encounter: Payer: Self-pay | Admitting: Gastroenterology

## 2023-06-27 ENCOUNTER — Other Ambulatory Visit: Payer: Self-pay

## 2023-07-16 ENCOUNTER — Other Ambulatory Visit: Payer: Self-pay

## 2023-07-17 ENCOUNTER — Other Ambulatory Visit: Payer: Self-pay

## 2023-07-17 MED ORDER — HYDROCHLOROTHIAZIDE 12.5 MG PO CAPS
12.5000 mg | ORAL_CAPSULE | Freq: Every day | ORAL | 1 refills | Status: DC
  Filled 2023-07-17: qty 90, 90d supply, fill #0
  Filled 2023-10-24: qty 90, 90d supply, fill #1

## 2023-07-17 MED ORDER — LOSARTAN POTASSIUM 100 MG PO TABS
100.0000 mg | ORAL_TABLET | Freq: Every day | ORAL | 1 refills | Status: DC
  Filled 2023-07-17: qty 90, 90d supply, fill #0
  Filled 2023-10-24: qty 90, 90d supply, fill #1

## 2023-07-23 ENCOUNTER — Ambulatory Visit
Admission: RE | Admit: 2023-07-23 | Discharge: 2023-07-23 | Disposition: A | Discharge status: Home / Self Care | Payer: Self-pay | Attending: Gastroenterology | Admitting: Gastroenterology

## 2023-07-23 ENCOUNTER — Ambulatory Visit: Admitting: Anesthesiology

## 2023-07-23 ENCOUNTER — Encounter: Payer: Self-pay | Admitting: Gastroenterology

## 2023-07-23 ENCOUNTER — Other Ambulatory Visit: Payer: Self-pay

## 2023-07-23 ENCOUNTER — Encounter
Admission: RE | Disposition: A | Discharge status: Home / Self Care | Payer: Self-pay | Source: Home / Self Care | Attending: Gastroenterology

## 2023-07-23 DIAGNOSIS — E119 Type 2 diabetes mellitus without complications: Secondary | ICD-10-CM | POA: Diagnosis not present

## 2023-07-23 DIAGNOSIS — Z7984 Long term (current) use of oral hypoglycemic drugs: Secondary | ICD-10-CM | POA: Diagnosis not present

## 2023-07-23 DIAGNOSIS — Z800 Family history of malignant neoplasm of digestive organs: Secondary | ICD-10-CM | POA: Insufficient documentation

## 2023-07-23 DIAGNOSIS — Z1211 Encounter for screening for malignant neoplasm of colon: Secondary | ICD-10-CM | POA: Insufficient documentation

## 2023-07-23 DIAGNOSIS — K648 Other hemorrhoids: Secondary | ICD-10-CM | POA: Diagnosis not present

## 2023-07-23 DIAGNOSIS — D125 Benign neoplasm of sigmoid colon: Secondary | ICD-10-CM | POA: Insufficient documentation

## 2023-07-23 DIAGNOSIS — I10 Essential (primary) hypertension: Secondary | ICD-10-CM | POA: Insufficient documentation

## 2023-07-23 HISTORY — PX: COLONOSCOPY: SHX5424

## 2023-07-23 HISTORY — DX: Drug-induced myopathy: T46.6X5A

## 2023-07-23 HISTORY — DX: Nonrheumatic aortic valve disorder, unspecified: I35.9

## 2023-07-23 HISTORY — DX: Drug-induced myopathy: G72.0

## 2023-07-23 HISTORY — DX: Essential (primary) hypertension: I10

## 2023-07-23 HISTORY — PX: POLYPECTOMY: SHX149

## 2023-07-23 HISTORY — DX: Type 2 diabetes mellitus without complications: E11.9

## 2023-07-23 SURGERY — COLONOSCOPY
Anesthesia: General

## 2023-07-23 MED ORDER — PROPOFOL 500 MG/50ML IV EMUL
INTRAVENOUS | Status: DC | PRN
  Administered 2023-07-23: 50 ug/kg/min via INTRAVENOUS

## 2023-07-23 MED ORDER — LIDOCAINE HCL (PF) 2 % IJ SOLN
INTRAMUSCULAR | Status: AC
  Filled 2023-07-23: qty 5

## 2023-07-23 MED ORDER — DEXMEDETOMIDINE HCL IN NACL 80 MCG/20ML IV SOLN
INTRAVENOUS | Status: DC | PRN
  Administered 2023-07-23: 8 ug via INTRAVENOUS
  Administered 2023-07-23: 12 ug via INTRAVENOUS

## 2023-07-23 MED ORDER — SODIUM CHLORIDE 0.9 % IV SOLN: INTRAVENOUS | Status: DC

## 2023-07-23 MED ORDER — STERILE WATER FOR IRRIGATION IR SOLN
Status: DC | PRN
  Administered 2023-07-23: 60 mL

## 2023-07-23 MED ORDER — PROPOFOL 10 MG/ML IV BOLUS
INTRAVENOUS | Status: DC | PRN
  Administered 2023-07-23: 20 mg via INTRAVENOUS
  Administered 2023-07-23: 40 mg via INTRAVENOUS

## 2023-07-23 MED ORDER — LIDOCAINE HCL (CARDIAC) PF 100 MG/5ML IV SOSY
PREFILLED_SYRINGE | INTRAVENOUS | Status: DC | PRN
  Administered 2023-07-23: 60 mg via INTRAVENOUS

## 2023-07-23 MED ORDER — PHENYLEPHRINE 80 MCG/ML (10ML) SYRINGE FOR IV PUSH (FOR BLOOD PRESSURE SUPPORT)
PREFILLED_SYRINGE | INTRAVENOUS | Status: AC
  Filled 2023-07-23: qty 10

## 2023-07-23 NOTE — Transfer of Care (Signed)
 Immediate Anesthesia Transfer of Care Note  Patient: Donna Hardy  Procedure(s) Performed: COLONOSCOPY POLYPECTOMY, INTESTINE  Patient Location: PACU  Anesthesia Type:General  Level of Consciousness: sedated  Airway & Oxygen Therapy: Patient Spontanous Breathing  Post-op Assessment: Report given to RN and Post -op Vital signs reviewed and stable  Post vital signs: Reviewed and stable  Last Vitals:  Vitals Value Taken Time  BP 110/63 07/23/23 10:25  Temp 36.4 C 07/23/23 10:24  Pulse 65 07/23/23 10:25  Resp 15 07/23/23 10:26  SpO2 98 % 07/23/23 10:25  Vitals shown include unfiled device data.  Last Pain:  Vitals:   07/23/23 1024  TempSrc: Tympanic  PainSc: Asleep         Complications: No notable events documented.

## 2023-07-23 NOTE — Op Note (Signed)
 [facility] Patient Name: Donna Hardy Procedure Date: 07/23/2023 9:38 AM MRN: [id number] Account #: [id number] Date of Birth: May 12, 1955 Admit Type: Outpatient Age: 68 Room: [facility] ENDO ROOM 1 Gender: Female Note Status: Finalized Instrument Name: Arvis [id number] Procedure:             Colonoscopy Indications:           High risk colon cancer surveillance: Personal history                         of colonic polyps, Family history of colon cancer Providers:             Elspeth Ozell Onita ROSALEA, DO Referring MD:          Layman ORN. Lenon MD, MD (Referring MD) Medicines:             Monitored Anesthesia Care Complications:         No immediate complications. Estimated blood loss:                         Minimal. Procedure:             Pre-Anesthesia Assessment:                        - Prior to the procedure, a History and Physical was                         performed, and patient medications and allergies were                         reviewed. The patient is competent. The risks and                         benefits of the procedure and the sedation options and                         risks were discussed with the patient. All questions                         were answered and informed consent was obtained.                         Patient identification and proposed procedure were                         verified by the physician, the nurse, the anesthetist                         and the technician in the endoscopy suite. Mental                         Status Examination: alert and oriented. Airway                         Examination: normal oropharyngeal airway and neck                         mobility. Respiratory Examination: clear to  auscultation. CV Examination: RRR, no murmurs, no S3                         or S4. Prophylactic Antibiotics: The patient does not                         require prophylactic  antibiotics. Prior                         Anticoagulants: The patient has taken no anticoagulant                         or antiplatelet agents. ASA Grade Assessment: II - A                         patient with mild systemic disease. After reviewing                         the risks and benefits, the patient was deemed in                         satisfactory condition to undergo the procedure. The                         anesthesia plan was to use monitored anesthesia care                         (MAC). Immediately prior to administration of                         medications, the patient was re-assessed for adequacy                         to receive sedatives. The heart rate, respiratory                         rate, oxygen saturations, blood pressure, adequacy of                         pulmonary ventilation, and response to care were                         monitored throughout the procedure. The physical                         status of the patient was re-assessed after the                         procedure.                        After obtaining informed consent, the colonoscope was                         passed under direct vision. Throughout the procedure,                         the patient's blood pressure, pulse, and oxygen  saturations were monitored continuously. The                         Colonoscope was introduced through the anus and                         advanced to the the cecum, identified by appendiceal                         orifice and ileocecal valve. The colonoscopy was                         performed without difficulty. The patient tolerated                         the procedure well. The quality of the bowel                         preparation was evaluated using the BBPS [facility] Bowel                         Preparation Scale) with scores of: Right Colon = 3,                         Transverse Colon = 3 and Left Colon = 3 (entire  mucosa                         seen well with no residual staining, small fragments                         of stool or opaque liquid). The total BBPS score                         equals 9. The ileocecal valve, appendiceal orifice,                         and rectum were photographed. Findings:      The perianal and digital rectal examinations were normal. Pertinent       negatives include normal sphincter tone.      A 1 to 2 mm polyp was found in the sigmoid colon. The polyp was sessile.       The polyp was removed with a jumbo cold forceps. Resection and retrieval       were complete. Estimated blood loss was minimal.      Retroflexion in the right colon was performed.      Non-bleeding internal hemorrhoids were found during retroflexion. The       hemorrhoids were small. Estimated blood loss: none.      The exam was otherwise without abnormality on direct and retroflexion       views. Impression:            - One 1 to 2 mm polyp in the sigmoid colon, removed                         with a jumbo cold forceps. Resected and retrieved.                        -  Non-bleeding internal hemorrhoids.                        - The examination was otherwise normal on direct and                         retroflexion views. Recommendation:        - Patient has a contact number available for                         emergencies. The signs and symptoms of potential                         delayed complications were discussed with the patient.                         Return to normal activities tomorrow. Written                         discharge instructions were provided to the patient.                        - Discharge patient to home.                        - Resume previous diet.                        - Continue present medications.                        - Await pathology results.                        - Repeat colonoscopy in 5 years for surveillance based                         on pathology  results.                        - Return to referring physician as previously                         scheduled.                        - The findings and recommendations were discussed with                         the patient. Procedure Code(s):     --- Professional ---                        [phone], Colonoscopy, flexible; with biopsy, single or                         multiple Diagnosis Code(s):     --- Professional ---                        Z86.010, Personal history of colonic polyps  D12.5, Benign neoplasm of sigmoid colon                        K64.8, Other hemorrhoids                        Z80.0, Family history of malignant neoplasm of                         digestive organs CPT copyright 2022 American Medical Association. All rights reserved. The codes documented in this report are preliminary and upon coder review may  be revised to meet current compliance requirements. Attending Participation:      I personally performed the entire procedure. Elspeth Jungling, DO [name] DO, DO 07/23/2023 10:25:22 AM This report has been signed electronically. Number of Addenda: 0 Note Initiated On: 07/23/2023 9:38 AM Scope Withdrawal Time: 0 hours 9 minutes 21 seconds  Total Procedure Duration: 0 hours 12 minutes 48 seconds  Estimated Blood Loss:  Estimated blood loss was minimal.      [facility]

## 2023-07-23 NOTE — Anesthesia Preprocedure Evaluation (Signed)
 Anesthesia Evaluation  Patient identified by MRN, date of birth, ID band Patient awake    Reviewed: Allergy & Precautions, NPO status , Patient's Chart, lab work & pertinent test results  History of Anesthesia Complications Negative for: history of anesthetic complications  Airway Mallampati: III  TM Distance: >3 FB Neck ROM: Full    Dental no notable dental hx. (+) Teeth Intact   Pulmonary neg pulmonary ROS, neg sleep apnea, neg COPD, Patient abstained from smoking.Not current smoker   Pulmonary exam normal breath sounds clear to auscultation       Cardiovascular Exercise Tolerance: Good METShypertension, Pt. 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  Reproductive/Obstetrics                             Anesthesia Physical Anesthesia Plan  ASA: 2  Anesthesia Plan: General   Post-op Pain Management: Minimal or no pain anticipated   Induction: Intravenous  PONV Risk Score and Plan: 3 and Propofol infusion, TIVA and Ondansetron  Airway Management Planned: Nasal Cannula  Additional Equipment: None  Intra-op Plan:   Post-operative Plan:   Informed Consent: I have reviewed the patients History and Physical, chart, labs and discussed the procedure including the risks, benefits and alternatives for the proposed anesthesia with the patient or authorized representative  who has indicated his/her understanding and acceptance.     Dental advisory given  Plan Discussed with: CRNA and Surgeon  Anesthesia Plan Comments: (Discussed risks of anesthesia with patient, including possibility of difficulty with spontaneous ventilation under anesthesia necessitating airway intervention, PONV, and rare risks such as cardiac or respiratory or neurological events, and allergic reactions. Discussed the role of CRNA in patient's perioperative care. Patient understands.)       Anesthesia Quick Evaluation

## 2023-07-23 NOTE — Anesthesia Postprocedure Evaluation (Signed)
 Anesthesia Post Note  Patient: Donna Hardy  Procedure(s) Performed: COLONOSCOPY POLYPECTOMY, INTESTINE  Patient location during evaluation: Endoscopy Anesthesia Type: General Level of consciousness: awake and alert Pain management: pain level controlled Vital Signs Assessment: post-procedure vital signs reviewed and stable Respiratory status: spontaneous breathing, nonlabored ventilation, respiratory function stable and patient connected to nasal cannula oxygen Cardiovascular status: blood pressure returned to baseline and stable Postop Assessment: no apparent nausea or vomiting Anesthetic complications: no   No notable events documented.   Last Vitals:  Vitals:   07/23/23 1034 07/23/23 1044  BP: 115/68 (!) 124/92  Pulse: 60 (!) 58  Resp: 13 19  Temp:    SpO2: 100% 98%    Last Pain:  Vitals:   07/23/23 1044  TempSrc:   PainSc: 0-No pain                 Rome Ade

## 2023-07-23 NOTE — Interval H&P Note (Signed)
 History and Physical Interval Note: Preprocedure H&P from 07/23/23  was reviewed and there was no interval change after seeing and examining the patient.  Written consent was obtained from the patient after discussion of risks, benefits, and alternatives. Patient has consented to proceed with Colonoscopy with possible intervention   07/23/2023 9:55 AM  Donna Hardy  has presented today for surgery, with the diagnosis of Family hx of colon cancer (Z80.0) Personal history of hyperplastic colon polyps (Z86.0102).  The various methods of treatment have been discussed with the patient and family. After consideration of risks, benefits and other options for treatment, the patient has consented to  Procedure(s): COLONOSCOPY (N/A) as a surgical intervention.  The patient's history has been reviewed, patient examined, no change in status, stable for surgery.  I have reviewed the patient's chart and labs.  Questions were answered to the patient's satisfaction.     [name]

## 2023-07-23 NOTE — H&P (Signed)
 Pre-Procedure H&P   Patient ID: Donna Hardy is a 68 y.o. female.  Gastroenterology Provider: [name], DO  Referring Provider: Dr. Lenon PCP: Lenon Layman ORN, MD  Date: 07/23/2023  HPI Ms. Donna Hardy is a 68 y.o. female who presents today for Colonoscopy for Personal history of colon polyps, family history of colon cancer .  Patient's mother with history of colon cancer at age 59.  The patient last underwent colonoscopy in 2012 and 2005 each of which were negative aside from scattered diverticulosis.  She reports daily bowel movement without melena or hematochezia.  No other GI complaints   Past Medical History:  Diagnosis Date   Aortic valve disorder    Controlled type 2 diabetes mellitus without complication (HCC)    Hyperlipidemia    Hypertension    Statin myopathy     Past Surgical History:  Procedure Laterality Date   COLONOSCOPY N/A     Family History Mother- crc- 61s No other h/o GI disease or malignancy  Review of Systems  Constitutional:  Negative for activity change, appetite change, chills, diaphoresis, fatigue, fever and unexpected weight change.  HENT:  Negative for trouble swallowing and voice change.   Respiratory:  Negative for shortness of breath and wheezing.   Cardiovascular:  Negative for chest pain, palpitations and leg swelling.  Gastrointestinal:  Negative for abdominal distention, abdominal pain, anal bleeding, blood in stool, constipation, diarrhea, nausea, rectal pain and vomiting.  Musculoskeletal:  Negative for arthralgias and myalgias.  Skin:  Negative for color change and pallor.  Neurological:  Negative for dizziness, syncope and weakness.  Psychiatric/Behavioral:  Negative for confusion.   All other systems reviewed and are negative.    Medications No current facility-administered medications on file prior to encounter.   Current Outpatient Medications on File Prior to Encounter  Medication Sig  Dispense Refill   atorvastatin (LIPITOR) 20 MG tablet Take 20 mg by mouth daily. (Patient not taking: Reported on 07/23/2023)     empagliflozin  (JARDIANCE ) 10 MG TABS tablet Take 1 tablet (10 mg total) by mouth once daily 90 tablet 11   empagliflozin  (JARDIANCE ) 10 MG TABS tablet Take 1 tablet (10 mg total) by mouth once daily 90 tablet 3   empagliflozin  (JARDIANCE ) 10 MG TABS tablet Take 1 tablet (10 mg total) by mouth once daily 30 tablet 11   hydrochlorothiazide  (MICROZIDE ) 12.5 MG capsule TAKE 1 CAPSULE (12.5 MG TOTAL) BY MOUTH ONCE DAILY (Patient not taking: Reported on 06/08/2020) 90 capsule 1   hydrochlorothiazide  (MICROZIDE ) 12.5 MG capsule TAKE 1 CAPSULE BY MOUTH ONCE DAILY 90 capsule 1   hydrochlorothiazide  (MICROZIDE ) 12.5 MG capsule Take 1 capsule (12.5 mg total) by mouth daily. 90 capsule 1   hydrochlorothiazide  (MICROZIDE ) 12.5 MG capsule Take 1 capsule (12.5 mg total) by mouth daily. 90 capsule 1   losartan  (COZAAR ) 100 MG tablet TAKE 1 TABLET (100 MG TOTAL) BY MOUTH ONCE DAILY 90 tablet 1   losartan  (COZAAR ) 100 MG tablet TAKE 1 TABLET (100 MG TOTAL) BY MOUTH ONCE DAILY 90 tablet 1   losartan  (COZAAR ) 100 MG tablet Take 1 tablet (100 mg total) by mouth once daily 90 tablet 1   losartan  (COZAAR ) 100 MG tablet Take 1 tablet (100 mg total) by mouth daily. 90 tablet 0   losartan  (COZAAR ) 100 MG tablet Take 1 tablet (100 mg total) by mouth daily. 90 tablet 3   losartan  (COZAAR ) 100 MG tablet Take 1 tablet (100 mg total) by mouth daily. 90 tablet  1   losartan -hydrochlorothiazide  (HYZAAR) 100-12.5 MG tablet Take 1 tablet by mouth daily.     Na Sulfate-K Sulfate-Mg Sulfate concentrate (SUPREP) 17.5-3.13-1.6 GM/177ML SOLN Take both bottles by mouth at the times instructed by your provider. 354 mL 0   pravastatin (PRAVACHOL) 10 MG tablet TAKE 1 TABLET BY MOUTH NIGHTLY 90 tablet 11    Pertinent medications related to GI and procedure were reviewed by me with the patient prior to the  procedure   Current Facility-Administered Medications:    0.9 %  sodium chloride  infusion, , Intravenous, Continuous, Donna Elspeth Sharper, DO, Last Rate: 20 mL/hr at 07/23/23 0914, New Bag at 07/23/23 0914  sodium chloride  20 mL/hr at 07/23/23 9085       Allergies  Allergen Reactions   Metformin And Related Nausea Only   Allergies were reviewed by me prior to the procedure  Objective   Body mass index is 38.41 kg/m. Vitals:   07/23/23 0906  BP: (!) 150/80  Pulse: 70  Resp: 15  Temp: 97.6 F (36.4 C)  TempSrc: Oral  SpO2: 100%  Weight: 104.7 kg  Height: 5' 5 (1.651 m)     Physical Exam Vitals and nursing note reviewed.  Constitutional:      General: She is not in acute distress.    Appearance: Normal appearance. She is obese. She is not ill-appearing, toxic-appearing or diaphoretic.  HENT:     Head: Normocephalic and atraumatic.     Nose: Nose normal.     Mouth/Throat:     Mouth: Mucous membranes are moist.     Pharynx: Oropharynx is clear.   Eyes:     General: No scleral icterus.    Extraocular Movements: Extraocular movements intact.    Cardiovascular:     Rate and Rhythm: Normal rate and regular rhythm.     Heart sounds: Normal heart sounds. No murmur heard.    No friction rub. No gallop.  Pulmonary:     Effort: Pulmonary effort is normal. No respiratory distress.     Breath sounds: Normal breath sounds. No wheezing, rhonchi or rales.  Abdominal:     General: Bowel sounds are normal. There is no distension.     Palpations: Abdomen is soft.     Tenderness: There is no abdominal tenderness. There is no guarding or rebound.   Musculoskeletal:     Cervical back: Neck supple.     Right lower leg: No edema.     Left lower leg: No edema.   Skin:    General: Skin is warm and dry.     Coloration: Skin is not jaundiced or pale.   Neurological:     General: No focal deficit present.     Mental Status: She is alert and oriented to person, place, and  time. Mental status is at baseline.   Psychiatric:        Mood and Affect: Mood normal.        Behavior: Behavior normal.        Thought Content: Thought content normal.        Judgment: Judgment normal.      Assessment:  Ms. Donna Hardy is a 68 y.o. female  who presents today for Colonoscopy for Personal history of colon polyps, family history of colon cancer .  Plan:  Colonoscopy with possible intervention today  Colonoscopy with possible biopsy, control of bleeding, polypectomy, and interventions as necessary has been discussed with the patient/patient representative. Informed consent was obtained from the patient/patient representative  after explaining the indication, nature, and risks of the procedure including but not limited to death, bleeding, perforation, missed neoplasm/lesions, cardiorespiratory compromise, and reaction to medications. Opportunity for questions was given and appropriate answers were provided. Patient/patient representative has verbalized understanding is amenable to undergoing the procedure.   [name], DO  [facility]  Portions of the record may have been created with voice recognition software. Occasional wrong-word or 'sound-a-like' substitutions may have occurred due to the inherent limitations of voice recognition software.  Read the chart carefully and recognize, using context, where substitutions may have occurred.

## 2023-07-24 LAB — SURGICAL PATHOLOGY

## 2023-07-27 ENCOUNTER — Other Ambulatory Visit: Payer: Self-pay

## 2023-07-27 MED ORDER — JARDIANCE 25 MG PO TABS
25.0000 mg | ORAL_TABLET | Freq: Every day | ORAL | 3 refills | Status: AC
  Filled 2023-07-27: qty 30, 30d supply, fill #0

## 2023-07-27 MED ORDER — EZETIMIBE 10 MG PO TABS
10.0000 mg | ORAL_TABLET | Freq: Every day | ORAL | 3 refills | Status: AC
  Filled 2023-07-27 – 2023-08-13 (×2): qty 90, 90d supply, fill #0
  Filled 2023-12-25: qty 90, 90d supply, fill #1

## 2023-08-07 ENCOUNTER — Other Ambulatory Visit: Payer: Self-pay

## 2023-08-13 ENCOUNTER — Other Ambulatory Visit: Payer: Self-pay

## 2023-08-14 ENCOUNTER — Other Ambulatory Visit: Payer: Self-pay

## 2023-09-28 ENCOUNTER — Other Ambulatory Visit: Payer: Self-pay

## 2023-10-24 ENCOUNTER — Other Ambulatory Visit: Payer: Self-pay

## 2023-12-25 ENCOUNTER — Other Ambulatory Visit: Payer: Self-pay

## 2024-01-18 ENCOUNTER — Other Ambulatory Visit: Payer: Self-pay

## 2024-01-18 MED ORDER — HYDROCHLOROTHIAZIDE 12.5 MG PO CAPS
12.5000 mg | ORAL_CAPSULE | Freq: Every day | ORAL | 1 refills | Status: AC
  Filled 2024-01-18: qty 90, 90d supply, fill #0

## 2024-01-18 MED ORDER — LOSARTAN POTASSIUM 100 MG PO TABS
100.0000 mg | ORAL_TABLET | Freq: Every day | ORAL | 1 refills | Status: AC
  Filled 2024-01-18: qty 90, 90d supply, fill #0

## 2024-01-21 ENCOUNTER — Other Ambulatory Visit: Payer: Self-pay

## 2024-02-29 ENCOUNTER — Other Ambulatory Visit: Payer: Self-pay

## 2024-02-29 MED ORDER — JARDIANCE 25 MG PO TABS
25.0000 mg | ORAL_TABLET | Freq: Every day | ORAL | 3 refills | Status: AC
  Filled 2024-02-29: qty 90, 90d supply, fill #0
